# Patient Record
Sex: Male | Born: 1960 | Race: White | Hispanic: No | Smoking: Never smoker
Health system: Southern US, Community
[De-identification: ages and names within clinical notes are randomized; demographics above are authoritative.]

## PROBLEM LIST (undated history)

## (undated) DIAGNOSIS — K219 Gastro-esophageal reflux disease without esophagitis: Secondary | ICD-10-CM

## (undated) DIAGNOSIS — I10 Essential (primary) hypertension: Secondary | ICD-10-CM

## (undated) DIAGNOSIS — I2699 Other pulmonary embolism without acute cor pulmonale: Secondary | ICD-10-CM

## (undated) HISTORY — PX: BACK SURGERY: SHX140

## (undated) HISTORY — DX: Gastro-esophageal reflux disease without esophagitis: K21.9

## (undated) HISTORY — DX: Essential (primary) hypertension: I10

## (undated) HISTORY — DX: Other pulmonary embolism without acute cor pulmonale: I26.99

---

## 2013-08-23 DIAGNOSIS — J301 Allergic rhinitis due to pollen: Secondary | ICD-10-CM | POA: Insufficient documentation

## 2015-05-02 DIAGNOSIS — E669 Obesity, unspecified: Secondary | ICD-10-CM | POA: Insufficient documentation

## 2015-05-02 DIAGNOSIS — G8929 Other chronic pain: Secondary | ICD-10-CM | POA: Insufficient documentation

## 2015-05-02 DIAGNOSIS — R079 Chest pain, unspecified: Secondary | ICD-10-CM | POA: Insufficient documentation

## 2015-05-02 DIAGNOSIS — E7800 Pure hypercholesterolemia, unspecified: Secondary | ICD-10-CM | POA: Insufficient documentation

## 2015-05-02 DIAGNOSIS — M545 Low back pain, unspecified: Secondary | ICD-10-CM | POA: Insufficient documentation

## 2015-05-02 DIAGNOSIS — G629 Polyneuropathy, unspecified: Secondary | ICD-10-CM | POA: Insufficient documentation

## 2015-05-02 DIAGNOSIS — Z86711 Personal history of pulmonary embolism: Secondary | ICD-10-CM | POA: Insufficient documentation

## 2015-05-02 DIAGNOSIS — I10 Essential (primary) hypertension: Secondary | ICD-10-CM | POA: Insufficient documentation

## 2018-07-03 DIAGNOSIS — K8689 Other specified diseases of pancreas: Secondary | ICD-10-CM | POA: Insufficient documentation

## 2018-11-08 ENCOUNTER — Ambulatory Visit: Payer: No Typology Code available for payment source | Admitting: Nurse Practitioner

## 2018-12-04 ENCOUNTER — Other Ambulatory Visit: Payer: Self-pay

## 2018-12-04 ENCOUNTER — Encounter: Payer: Self-pay | Admitting: Pain Medicine

## 2018-12-04 ENCOUNTER — Ambulatory Visit: Payer: No Typology Code available for payment source | Attending: Pain Medicine | Admitting: Pain Medicine

## 2018-12-04 DIAGNOSIS — Z451 Encounter for adjustment and management of infusion pump: Secondary | ICD-10-CM | POA: Insufficient documentation

## 2018-12-04 DIAGNOSIS — M79602 Pain in left arm: Secondary | ICD-10-CM

## 2018-12-04 DIAGNOSIS — M5136 Other intervertebral disc degeneration, lumbar region: Secondary | ICD-10-CM | POA: Diagnosis not present

## 2018-12-04 DIAGNOSIS — M7918 Myalgia, other site: Secondary | ICD-10-CM | POA: Insufficient documentation

## 2018-12-04 DIAGNOSIS — M792 Neuralgia and neuritis, unspecified: Secondary | ICD-10-CM | POA: Insufficient documentation

## 2018-12-04 DIAGNOSIS — Z981 Arthrodesis status: Secondary | ICD-10-CM

## 2018-12-04 DIAGNOSIS — M961 Postlaminectomy syndrome, not elsewhere classified: Secondary | ICD-10-CM

## 2018-12-04 DIAGNOSIS — G8929 Other chronic pain: Secondary | ICD-10-CM

## 2018-12-04 DIAGNOSIS — M5030 Other cervical disc degeneration, unspecified cervical region: Secondary | ICD-10-CM

## 2018-12-04 DIAGNOSIS — M545 Low back pain, unspecified: Secondary | ICD-10-CM

## 2018-12-04 DIAGNOSIS — Z9889 Other specified postprocedural states: Secondary | ICD-10-CM

## 2018-12-04 DIAGNOSIS — Z978 Presence of other specified devices: Secondary | ICD-10-CM | POA: Insufficient documentation

## 2018-12-04 DIAGNOSIS — M546 Pain in thoracic spine: Secondary | ICD-10-CM

## 2018-12-04 DIAGNOSIS — M542 Cervicalgia: Secondary | ICD-10-CM

## 2018-12-04 DIAGNOSIS — M5412 Radiculopathy, cervical region: Secondary | ICD-10-CM | POA: Insufficient documentation

## 2018-12-04 DIAGNOSIS — G894 Chronic pain syndrome: Secondary | ICD-10-CM

## 2018-12-04 DIAGNOSIS — Z9689 Presence of other specified functional implants: Secondary | ICD-10-CM

## 2018-12-04 DIAGNOSIS — M899 Disorder of bone, unspecified: Secondary | ICD-10-CM

## 2018-12-04 DIAGNOSIS — G8928 Other chronic postprocedural pain: Secondary | ICD-10-CM

## 2018-12-04 DIAGNOSIS — Z79899 Other long term (current) drug therapy: Secondary | ICD-10-CM

## 2018-12-04 DIAGNOSIS — Z789 Other specified health status: Secondary | ICD-10-CM | POA: Insufficient documentation

## 2018-12-04 DIAGNOSIS — M5134 Other intervertebral disc degeneration, thoracic region: Secondary | ICD-10-CM | POA: Insufficient documentation

## 2018-12-04 NOTE — Patient Instructions (Addendum)
____________________________________________________________________________________________  Medication Rules  Purpose: To inform patients, and their family members, of our rules and regulations.  Applies to: All patients receiving prescriptions (written or electronic).  Pharmacy of record: Pharmacy where electronic prescriptions will be sent. If written prescriptions are taken to a different pharmacy, please inform the nursing staff. The pharmacy listed in the electronic medical record should be the one where you would like electronic prescriptions to be sent.  Electronic prescriptions: In compliance with the [place] Strengthen Opioid Misuse Prevention (STOP) Act of 2017 (Session Law 2017-74/H243), effective August 15, 2018, all controlled substances must be electronically prescribed. Calling prescriptions to the pharmacy will cease to exist.  Prescription refills: Only during scheduled appointments. Applies to all prescriptions.  NOTE: The following applies primarily to controlled substances (Opioid* Pain Medications).   Patient's responsibilities: 1. Pain Pills: Bring all pain pills to every appointment (except for procedure appointments). 2. Pill Bottles: Bring pills in original pharmacy bottle. Always bring the newest bottle. Bring bottle, even if empty. 3. Medication refills: You are responsible for knowing and keeping track of what medications you take and those you need refilled. The day before your appointment: write a list of all prescriptions that need to be refilled. The day of the appointment: give the list to the admitting nurse. Prescriptions will be written only during appointments. No prescriptions will be written on procedure days. If you forget a medication: it will not be "Called in", "Faxed", or "electronically sent". You will need to get another appointment to get these prescribed. No early refills. Do not call asking to have your prescription filled  early. 4. Prescription Accuracy: You are responsible for carefully inspecting your prescriptions before leaving our office. Have the discharge nurse carefully go over each prescription with you, before taking them home. Make sure that your name is accurately spelled, that your address is correct. Check the name and dose of your medication to make sure it is accurate. Check the number of pills, and the written instructions to make sure they are clear and accurate. Make sure that you are given enough medication to last until your next medication refill appointment. 5. Taking Medication: Take medication as prescribed. When it comes to controlled substances, taking less pills or less frequently than prescribed is permitted and encouraged. Never take more pills than instructed. Never take medication more frequently than prescribed.  6. Inform other Doctors: Always inform, all of your healthcare providers, of all the medications you take. 7. Pain Medication from other Providers: You are not allowed to accept any additional pain medication from any other Doctor or Healthcare provider. There are two exceptions to this rule. (see below) In the event that you require additional pain medication, you are responsible for notifying us, as stated below. 8. Medication Agreement: You are responsible for carefully reading and following our Medication Agreement. This must be signed before receiving any prescriptions from our practice. Safely store a copy of your signed Agreement. Violations to the Agreement will result in no further prescriptions. (Additional copies of our Medication Agreement are available upon request.) 9. Laws, Rules, & Regulations: All patients are expected to follow all Federal and State Laws, Statutes, Rules, & Regulations. Ignorance of the Laws does not constitute a valid excuse. The use of any illegal substances is prohibited. 10. Adopted CDC guidelines & recommendations: Target dosing levels will be  at or below 60 MME/day. Use of benzodiazepines** is not recommended.  Exceptions: There are only two exceptions to the rule of not   receiving pain medications from other Healthcare Providers. 1. Exception #1 (Emergencies): In the event of an emergency (i.e.: accident requiring emergency care), you are allowed to receive additional pain medication. However, you are responsible for: As soon as you are able, call our office [phone], at any time of the day or night, and leave a message stating your name, the date and nature of the emergency, and the name and dose of the medication prescribed. In the event that your call is answered by a member of our staff, make sure to document and save the date, time, and the name of the person that took your information.  2. Exception #2 (Planned Surgery): In the event that you are scheduled by another doctor or dentist to have any type of surgery or procedure, you are allowed (for a period no longer than 30 days), to receive additional pain medication, for the acute post-op pain. However, in this case, you are responsible for picking up a copy of our "Post-op Pain Management for Surgeons" handout, and giving it to your surgeon or dentist. This document is available at our office, and does not require an appointment to obtain it. Simply go to our office during business hours (Monday-Thursday from 8:00 AM to 4:00 PM) (Friday 8:00 AM to 12:00 Noon) or if you have a scheduled appointment with us, prior to your surgery, and ask for it by name. In addition, you will need to provide us with your name, name of your surgeon, type of surgery, and date of procedure or surgery.  *Opioid medications include: morphine, codeine, oxycodone, oxymorphone, hydrocodone, hydromorphone, meperidine, tramadol, tapentadol, buprenorphine, fentanyl, methadone. **Benzodiazepine medications include: diazepam (Valium), alprazolam (Xanax), clonazepam (Klonopine), lorazepam (Ativan), clorazepate  (Tranxene), chlordiazepoxide (Librium), estazolam (Prosom), oxazepam (Serax), temazepam (Restoril), triazolam (Halcion) (Last updated: 10/12/2017) ____________________________________________________________________________________________   ____________________________________________________________________________________________  Medication Recommendations and Reminders  Applies to: All patients receiving prescriptions (written and/or electronic).  Medication Rules & Regulations: These rules and regulations exist for your safety and that of others. They are not flexible and neither are we. Dismissing or ignoring them will be considered "non-compliance" with medication therapy, resulting in complete and irreversible termination of such therapy. (See document titled "Medication Rules" for more details.) In all conscience, because of safety reasons, we cannot continue providing a therapy where the patient does not follow instructions.  Pharmacy of record:   Definition: This is the pharmacy where your electronic prescriptions will be sent.   We do not endorse any particular pharmacy.  You are not restricted in your choice of pharmacy.  The pharmacy listed in the electronic medical record should be the one where you want electronic prescriptions to be sent.  If you choose to change pharmacy, simply notify our nursing staff of your choice of new pharmacy.  Recommendations:  Keep all of your pain medications in a safe place, under lock and key, even if you live alone.   After you fill your prescription, take 1 week's worth of pills and put them away in a safe place. You should keep a separate, properly labeled bottle for this purpose. The remainder should be kept in the original bottle. Use this as your primary supply, until it runs out. Once it's gone, then you know that you have 1 week's worth of medicine, and it is time to come in for a prescription refill. If you do this correctly, it  is unlikely that you will ever run out of medicine.  To make sure that the above recommendation works,   it is very important that you make sure your medication refill appointments are scheduled at least 1 week before you run out of medicine. To do this in an effective manner, make sure that you do not leave the office without scheduling your next medication management appointment. Always ask the nursing staff to show you in your prescription , when your medication will be running out. Then arrange for the receptionist to get you a return appointment, at least 7 days before you run out of medicine. Do not wait until you have 1 or 2 pills left, to come in. This is very poor planning and does not take into consideration that we may need to cancel appointments due to bad weather, sickness, or emergencies affecting our staff.  "Partial Fill": If for any reason your pharmacy does not have enough pills/tablets to completely fill or refill your prescription, do not allow for a "partial fill". You will need a separate prescription to fill the remaining amount, which we will not provide. If the reason for the partial fill is your insurance, you will need to talk to the pharmacist about payment alternatives for the remaining tablets, but again, do not accept a partial fill.  Prescription refills and/or changes in medication(s):   Prescription refills, and/or changes in dose or medication, will be conducted only during scheduled medication management appointments. (Applies to both, written and electronic prescriptions.)  No refills on procedure days. No medication will be changed or started on procedure days. No changes, adjustments, and/or refills will be conducted on a procedure day. Doing so will interfere with the diagnostic portion of the procedure.  No phone refills. No medications will be "called into the pharmacy".  No Fax refills.  No weekend refills.  No Holliday refills.  No after hours  refills.  Remember:  Business hours are:  Monday to Thursday 8:00 AM to 4:00 PM Provider's Schedule: Crystal King, NP - Appointments are:  Medication management: Monday to Thursday 8:00 AM to 4:00 PM Dotsie Gillette, MD - Appointments are:  Medication management: Monday and Wednesday 8:00 AM to 4:00 PM Procedure day: Tuesday and Thursday 7:30 AM to 4:00 PM Bilal Lateef, MD - Appointments are:  Medication management: Tuesday and Thursday 8:00 AM to 4:00 PM Procedure day: Monday and Wednesday 7:30 AM to 4:00 PM (Last update: 10/12/2017) ____________________________________________________________________________________________   ____________________________________________________________________________________________  CANNABIDIOL (AKA: CBD Oil or Pills)  Applies to: All patients receiving prescriptions of controlled substances (written and/or electronic).  General Information: Cannabidiol (CBD) was discovered in 1940. It is one of some 113 identified cannabinoids in cannabis (Marijuana) plants, accounting for up to 40% of the plant's extract. As of 2018, preliminary clinical research on cannabidiol included studies of anxiety, cognition, movement disorders, and pain.  Cannabidiol is consummed in multiple ways, including inhalation of cannabis smoke or vapor, as an aerosol spray into the cheek, and by mouth. It may be supplied as CBD oil containing CBD as the active ingredient (no added tetrahydrocannabinol (THC) or terpenes), a full-plant CBD-dominant hemp extract oil, capsules, dried cannabis, or as a liquid solution. CBD is thought not have the same psychoactivity as THC, and may affect the actions of THC. Studies suggest that CBD may interact with different biological targets, including cannabinoid receptors and other neurotransmitter receptors. As of 2018 the mechanism of action for its biological effects has not been determined.  In the [place], cannabidiol has a limited  approval by the Food and Drug Administration (FDA) for treatment of only two types   of epilepsy disorders. The side effects of long-term use of the drug include somnolence, decreased appetite, diarrhea, fatigue, malaise, weakness, sleeping problems, and others.  CBD remains a Schedule I drug prohibited for any use.  Legality: Some manufacturers ship CBD products nationally, an illegal action which the FDA has not enforced in 2018, with CBD remaining the subject of an FDA investigational new drug evaluation, and is not considered legal as a dietary supplement or food ingredient as of December 2018. Federal illegality has made it difficult historically to conduct research on CBD. CBD is openly sold in head shops and health food stores in some states where such sales have not been explicitly legalized.  Warning: Because it is not FDA approved for general use or treatment of pain, it is not required to undergo the same manufacturing controls as prescription drugs.  This means that the available cannabidiol (CBD) may be contaminated with THC.  If this is the case, it will trigger a positive urine drug screen (UDS) test for cannabinoids (Marijuana).  Because a positive UDS for illicit substances is a violation of our medication agreement, your opioid analgesics (pain medicine) may be permanently discontinued. (Last update: 11/02/2017) ____________________________________________________________________________________________    ______________________________________________________________________________________________  Specialty Pain Scale  Introduction:  There are significant differences in how pain is reported. The word pain usually refers to physical pain, but it is also a common synonym of suffering. The medical community uses a scale from 0 (zero) to 10 (ten) to report pain level. Zero (0) is described as "no pain", while ten (10) is described as "the worse pain you can imagine". The problem with  this scale is that physical pain is reported along with suffering. Suffering refers to mental pain, or more often yet it refers to any unpleasant feeling, emotion or aversion associated with the perception of harm or threat of harm. It is the psychological component of pain.  Pain Specialists prefer to separate the two components. The pain scale used by this practice is the Verbal Numerical Rating Scale (VNRS-11). This scale is for the physical pain only. DO NOT INCLUDE how your pain psychologically affects you. This scale is for adults 66 years of age and older. It has 11 (eleven) levels. The 1st level is 0/10. This means: "right now, I have no pain". In the context of pain management, it also means: "right now, my physical pain is under control with the current therapy".  General Information:  The scale should reflect your current level of pain. Unless you are specifically asked for the level of your worst pain, or your average pain. If you are asked for one of these two, then it should be understood that it is over the past 24 hours.  Levels 1 (one) through 5 (five) are described below, and can be treated as an outpatient. Ambulatory pain management facilities such as ours are more than adequate to treat these levels. Levels 6 (six) through 10 (ten) are also described below, however, these must be treated as a hospitalized patient. While levels 6 (six) and 7 (seven) may be evaluated at an urgent care facility, levels 8 (eight) through 10 (ten) constitute medical emergencies and as such, they belong in a hospital's emergency department. When having these levels (as described below), do not come to our office. Our facility is not equipped to manage these levels. Go directly to an urgent care facility or an emergency department to be evaluated.  Definitions:  Activities of Daily Living (ADL): Activities of daily living (  ADL or ADLs) is a term used in healthcare to refer to people's daily self-care  activities. Health professionals often use a person's ability or inability to perform ADLs as a measurement of their functional status, particularly in regard to people post injury, with disabilities and the elderly. There are two ADL levels: Basic and Instrumental. Basic Activities of Daily Living (BADL  or BADLs) consist of self-care tasks that include: Bathing and showering; personal hygiene and grooming (including brushing/combing/styling hair); dressing; Toilet hygiene (getting to the toilet, cleaning oneself, and getting back up); eating and self-feeding (not including cooking or chewing and swallowing); functional mobility, often referred to as "transferring", as measured by the ability to walk, get in and out of bed, and get into and out of a chair; the broader definition (moving from one place to another while performing activities) is useful for people with different physical abilities who are still able to get around independently. Basic ADLs include the things many people do when they get up in the morning and get ready to go out of the house: get out of bed, go to the toilet, bathe, dress, groom, and eat. On the average, loss of function typically follows a particular order. Hygiene is the first to go, followed by loss of toilet use and locomotion. The last to go is the ability to eat. When there is only one remaining area in which the person is independent, there is a 62.9% chance that it is eating and only a 3.5% chance that it is hygiene. Instrumental Activities of Daily Living (IADL or IADLs) are not necessary for fundamental functioning, but they let an individual live independently in a community. IADL consist of tasks that include: cleaning and maintaining the house; home establishment and maintenance; care of others (including selecting and supervising caregivers); care of pets; child rearing; managing money; managing financials (investments, etc.); meal preparation and cleanup; shopping for  groceries and necessities; moving within the community; safety procedures and emergency responses; health management and maintenance (taking prescribed medications); and using the telephone or other form of communication.  Instructions:  Most patients tend to report their pain as a combination of two factors, their physical pain and their psychosocial pain. This last one is also known as "suffering" and it is reflection of how physical pain affects you socially and psychologically. From now on, report them separately.  From this point on, when asked to report your pain level, report only your physical pain. Use the following table for reference.  Pain Clinic Pain Levels (0-5/10)  Pain Level Score  Description  No Pain 0   Mild pain 1 Nagging, annoying, but does not interfere with basic activities of daily living (ADL). Patients are able to eat, bathe, get dressed, toileting (being able to get on and off the toilet and perform personal hygiene functions), transfer (move in and out of bed or a chair without assistance), and maintain continence (able to control bladder and bowel functions). Blood pressure and heart rate are unaffected. A normal heart rate for a healthy adult ranges from 60 to 100 bpm (beats per minute).   Mild to moderate pain 2 Noticeable and distracting. Impossible to hide from other people. More frequent flare-ups. Still possible to adapt and function close to normal. It can be very annoying and may have occasional stronger flare-ups. With discipline, patients may get used to it and adapt.   Moderate pain 3 Interferes significantly with activities of daily living (ADL). It becomes difficult to feed, bathe, get  dressed, get on and off the toilet or to perform personal hygiene functions. Difficult to get in and out of bed or a chair without assistance. Very distracting. With effort, it can be ignored when deeply involved in activities.   Moderately severe pain 4 Impossible to ignore for  more than a few minutes. With effort, patients may still be able to manage work or participate in some social activities. Very difficult to concentrate. Signs of autonomic nervous system discharge are evident: dilated pupils (mydriasis); mild sweating (diaphoresis); sleep interference. Heart rate becomes elevated (>115 bpm). Diastolic blood pressure (lower number) rises above 100 mmHg. Patients find relief in laying down and not moving.   Severe pain 5 Intense and extremely unpleasant. Associated with frowning face and frequent crying. Pain overwhelms the senses.  Ability to do any activity or maintain social relationships becomes significantly limited. Conversation becomes difficult. Pacing back and forth is common, as getting into a comfortable position is nearly impossible. Pain wakes you up from deep sleep. Physical signs will be obvious: pupillary dilation; increased sweating; goosebumps; brisk reflexes; cold, clammy hands and feet; nausea, vomiting or dry heaves; loss of appetite; significant sleep disturbance with inability to fall asleep or to remain asleep. When persistent, significant weight loss is observed due to the complete loss of appetite and sleep deprivation.  Blood pressure and heart rate becomes significantly elevated. Caution: If elevated blood pressure triggers a pounding headache associated with blurred vision, then the patient should immediately seek attention at an urgent or emergency care unit, as these may be signs of an impending stroke.    Emergency Department Pain Levels (6-10/10)  Emergency Room Pain 6 Severely limiting. Requires emergency care and should not be seen or managed at an outpatient pain management facility. Communication becomes difficult and requires great effort. Assistance to reach the emergency department may be required. Facial flushing and profuse sweating along with potentially dangerous increases in heart rate and blood pressure will be evident.    Distressing pain 7 Self-care is very difficult. Assistance is required to transport, or use restroom. Assistance to reach the emergency department will be required. Tasks requiring coordination, such as bathing and getting dressed become very difficult.   Disabling pain 8 Self-care is no longer possible. At this level, pain is disabling. The individual is unable to do even the most "basic" activities such as walking, eating, bathing, dressing, transferring to a bed, or toileting. Fine motor skills are lost. It is difficult to think clearly.   Incapacitating pain 9 Pain becomes incapacitating. Thought processing is no longer possible. Difficult to remember your own name. Control of movement and coordination are lost.   The worst pain imaginable 10 At this level, most patients pass out from pain. When this level is reached, collapse of the autonomic nervous system occurs, leading to a sudden drop in blood pressure and heart rate. This in turn results in a temporary and dramatic drop in blood flow to the brain, leading to a loss of consciousness. Fainting is one of the body's self defense mechanisms. Passing out puts the brain in a calmed state and causes it to shut down for a while, in order to begin the healing process.    Summary: 1.   Refer to this scale when providing [place] with your pain level. 2.   Be accurate and careful when reporting your pain level. This will help with your care. 3.   Over-reporting your pain level will lead to loss of credibility. 4.  Even a level of 1/10 means that there is pain and will be treated at our facility. 5.   High, inaccurate reporting will be documented as "Symptom Exaggeration", leading to loss of credibility and suspicions of possible secondary gains such as obtaining more narcotics, or wanting to appear disabled, for fraudulent reasons. 6.   Only pain levels of 5 or below will be seen at our facility. 7.   Pain levels of 6 and above will be sent to the  Emergency Department and the appointment cancelled.  ______________________________________________________________________________________________    ____________________________________________________________________________________________  Drug Holidays (Slow)  What is a "Drug Holiday"? Drug Holiday: is the name given to the period of time during which a patient stops taking a medication(s) for the purpose of eliminating tolerance to the drug.  Benefits . Improved effectiveness of opioids. . Decreased opioid dose needed to achieve benefits. . Improved pain with lesser dose.  What is tolerance? Tolerance: is the progressive decreased in effectiveness of a drug due to its repetitive use. With repetitive use, the body gets use to the medication and as a consequence, it loses its effectiveness. This is a common problem seen with opioid pain medications. As a result, a larger dose of the drug is needed to achieve the same effect that used to be obtained with a smaller dose.  How long should a "Drug Holiday" last? At least 14 consecutive days. (2 weeks)  What are withdrawals? Withdrawals: refers to the wide range of symptoms that occur after stopping or dramatically reducing opiate drugs after heavy and prolonged use. Withdrawal symptoms do not occur to patients that use low dose opioids, or those who take the medication sporadically. Contrary to benzodiazepine (example: Valium, Xanax, etc.) or alcohol withdrawals ("Delirium Tremens"), opioid withdrawals are not lethal. Withdrawals are the physical manifestation of the body getting rid of the excess receptors.  Expected Symptoms Early symptoms of withdrawal may include: . Agitation . Anxiety . Muscle aches . Increased tearing . Insomnia . Runny nose . Sweating . Yawning  Late symptoms of withdrawal may include: . Abdominal cramping . Diarrhea . Dilated pupils . Goose bumps . Nausea . Vomiting  Will I experience  withdrawals? Due to the slow nature of the taper, it is very unlikely that you will experience any.  What is a slow taper? Taper: refers to the gradual decrease in dose. ___________________________________________________________________________________________

## 2018-12-04 NOTE — Progress Notes (Signed)
Patient's Name: Jeffery Cox  MRN: [id number]  Referring Provider: [facility]  DOB: 06/28/1961  PCP: [facility]  DOS: 12/04/2018  Note by: Gaspar Cola, MD  Service setting: Ambulatory outpatient  Specialty: Interventional Pain Management  Location: [facility] Pain Management Virtual Visit  Visit type: Initial Patient Evaluation  Patient type: New Patient   Pain Management Virtual Encounter Note - Virtual Visit via Telephone Telehealth (real-time audio visits between healthcare provider and patient).  Patient's Phone No.:  [phone] (home); There is no such number on file (mobile).; (Preferred) [phone] No e-mail address on record No Pharmacies Listed  Pre-screening note:  Our staff contacted Jeffery Cox and offered him an "in person", "face-to-face" appointment versus a telephone encounter. He indicated preferring the telephone encounter, at this time.  Primary Reason(s) for Visit: Tele-Encounter for initial evaluation of one or more chronic problems (new to examiner) potentially causing chronic pain, and posing a threat to normal musculoskeletal function. (Level of risk: High) CC: Back Pain (lower)  I contacted Jeffery Cox on 12/04/2018 at 2:56 PM via telephone and clearly identified myself as Gaspar Cola, MD. I verified that I was speaking with the correct person using two identifiers (Name and date of birth: 1961/02/28).  Advanced Informed Consent I sought verbal advanced consent from Jeffery Cox for virtual visit interactions. I informed Jeffery Cox of possible security and privacy concerns, risks, and limitations associated with providing "not-in-person" medical evaluation and management services. I also informed Jeffery Cox of the availability of "in-person" appointments. Finally, I informed him that there would be a charge for the virtual visit and that he could be  personally, fully or partially, financially responsible for it. Jeffery Cox expressed  understanding and agreed to proceed.   HPI  Jeffery Cox is a 58 y.o. year old, male patient, contacted today for an initial evaluation of his chronic pain. He has Chest pain; Chronic low back pain (Primary area of Pain) (Bilateral) (L>R); Essential hypertension with goal blood pressure less than 130/80; History of pulmonary embolism; Neuropathy; Obesity (BMI 30-39.9); Pancreatic mass; Pure hypercholesterolemia; Allergic rhinitis due to pollen; Chronic pain syndrome; DDD (degenerative disc disease), lumbar; DDD (degenerative disc disease), thoracic; DDD (degenerative disc disease), cervical; Chronic thoracic back pain (Secondary Area of Pain) (Bilateral) (L>R); Failed back surgical syndrome; History of cervical spinal surgery; Chronic neck pain with history of cervical spinal surgery (Tertiary Area of Pain) (Bilateral) (L>R); Chronic cervical radiculitis (Left); Chronic upper extremity pain (Fourth Area of Pain) (Left); Cervicalgia (Bilateral) (L>R); History of lumbar spine surgery; History of lumbar spinal fusion; History of thoracic spinal fusion; Neurogenic pain; Chronic musculoskeletal pain; S/P removal of spinal cord stimulator; Presence of Medtronic intrathecal pump; Adjustment and management of infusion pump; Disorder of skeletal system; Pharmacologic therapy; and Problems influencing health status on their problem list.  Pain Assessment: Location: Lower Back Radiating: n/a Onset: More than a month ago Duration: Chronic pain Quality: Sharp, Stabbing Severity: 4 /10 (subjective, self-reported pain score)  Effect on ADL: difficulty performing daily activities Timing: Intermittent Modifying factors: lying down, medications   Onset and Duration: Sudden and Present longer than 3 months Cause of pain: kyphosis, arthrits Severity: No change since onset, NAS-11 at its worse: 8/10, NAS-11 at its best: 4/10, NAS-11 now: 4/10 and NAS-11 on the average: 5/10 Timing: Not influenced by the time of the day,  During activity or exercise, After activity or exercise and After a period of immobility Aggravating Factors: Bending, Prolonged sitting and Prolonged standing Alleviating Factors:  Lying down and Medications Associated Problems: Depression, Numbness and Tingling Quality of Pain: Sharp and Stabbing Previous Examinations or Tests: CT scan, Ct-Myelogram, MRI scan, Neurological evaluation, Neurosurgical evaluation, Orthopedic evaluation, Chiropractic evaluation and Psychiatric evaluation Previous Treatments: Chiropractic manipulations, Epidural steroid injections, Morphine pump, Narcotic medications, Physical Therapy, Spinal cord stimulator and TENS  According to the patient the primary area of pain is that of the lower back, bilaterally, with the left side being worse than the right.  He has a history of 4 back surgeries starting with a fusion in 2005, a decompressive surgery for a hematoma 2 weeks later, another fusion around September 2016, and the last 1 around February 2019.  In addition to this he had a spinal cord stimulator trial, implant, and removal, as well as an intrathecal pump trial and implant.  The spinal cord stimulator was initially implanted around 2007 but it never worked well and after many attempts at trying to make it work, it was removed around 2009.  The spinal cord stimulator was a Medtronic dual lead device that was put in by Dr. Lollie Marrow (pain management/anesthesia).  [facility].  Subsequently the patient had an intrathecal pump implant around 2012 which seem to have worked well and that was replaced by a second pump around July 2019.  This was again done by Dr. Lollie Marrow at the [facility].  The pump is a Medtronic SynchroMed II device ([id number]; model [id number]) (implanted in January 18, 2018) this is a 40 mL pump running preservative-free Dilaudid 8 mg/mL + preservative-free bupivacaine 4 mg/mL +  preservative-free clonidine 25 mcg/mL at a rate of 1.4982 mg/day of Dilaudid, 0.7491 mg/day of bupivacaine, and 4.682 mcg/day of clonidine.  The pump was last refilled on 11/30/2018 and the alarm date is 01/26/2019.  In addition to this the patient is using a fentanyl 50 mcg/h patch.  He describes that after the [facility] was [facility], [facility] and now he does not get coverage for the pump refill visits.  At the very beginning the pump was being refilled on a monthly basis but this was soon changed to every 2 months by having decreased the infusion rate.  The entire history of the concentration and rate changes is unavailable to [place] at this time.  The patient's secondary area of pain is that of the upper thoracic back, bilaterally, with the left side being worse than the right.  He describes having had a fusion all the way up to T3.  In addition he describes having had a cervical fusion from C4-C6.  He describes having had CT scans, MRIs, all of which were done at [facility], [facility], [place].  However when I checked "care everywhere" I was unable to see any recent imaging from those regions.  He denies any type of interventional therapies in that area.  The patient's third area of pain is that of the neck, primarily in the area of the back, bilaterally, with the left being worse than the right.  He describes having had 2 cervical spine surgeries with the first 1 having been around 2004 at [facility] in [place].  The second 1 was around 2008 at [facility] in [facility].  Once again, he denies any nerve blocks in that area.  The patient's fourth area of pain is that of left upper extremity pain going down to the elbow through the front and lateral portion of the  arm as it gets closer to the elbow.  He denies any weakness, numbness, or tingling reaching the hand.  From the medical standpoint, he does have a history of having had a recent pancreatic cyst  discovered.  The patient was informed that my practice is divided into two sections: an interventional pain management section, as well as a completely separate and distinct medication management section. I explained that I have procedure days for my interventional therapies, and evaluation days for follow-ups and medication management. Because of the amount of documentation required during both, they are kept separated. This means that there is the possibility that he may be scheduled for a procedure on one day, and medication management the next. I have also informed him that because of staffing and facility limitations, I no longer take patients for medication management only. To illustrate the reasons for this, I gave the patient the example of surgeons, and how inappropriate it would be to refer a patient to his/her care, just to write for the post-surgical antibiotics on a surgery done by a different surgeon.   Because interventional pain management is my board-certified specialty, the patient was informed that joining my practice means that they are open to any and all interventional therapies. I made it clear that this does not mean that they will be forced to have any procedures done. What this means is that I believe interventional therapies to be essential part of the diagnosis and proper management of chronic pain conditions. Therefore, patients not interested in these interventional alternatives will be better served under the care of a different practitioner.  The patient was also made aware of my Comprehensive Pain Management Safety Guidelines where by joining my practice, they limit all of their nerve blocks and joint injections to those done by our practice, for as long as we are retained to manage their care.   Historic Controlled Substance Pharmacotherapy Review  PMP and historical list of controlled substances: Fentanyl 50 mcg/h patch; fentanyl 25 mcg/h patch; oxycodone IR 5 mg;  hydrocodone/APAP 10/325; methadone 5 mg. Most recent opioid analgesic: Fentanyl 50 Mcg/Hr Patch (120.00 MME) (#10) (11/21/18) (by: Marval Regal, MD) + Hydromorphone Hcl Powder (1280.00 MME) Current opioid analgesics: Fentanyl 50 Mcg/Hr Patch +  Hydromorphone Hcl Powder Highest recorded MME/day: 1,400 mg/day MME/day: 1,400 mg/day Medications: Bottles not available for inspection. Pharmacodynamics: Desired effects: Analgesia: The patient reports >50% benefit. Reported improvement in function: The patient reports medication allows him to accomplish basic ADLs. Clinically meaningful improvement in function (CMIF): Sustained CMIF goals met Perceived effectiveness: Described as relatively effective, allowing for increase in activities of daily living (ADL) Undesirable effects: Side-effects or Adverse reactions: None reported Historical Monitoring: The patient  reports previous drug use. List of all UDS Test(s): No results found. List of other Serum/Urine Drug Screening Test(s):  No results found. Historical Background Evaluation: [place] PMP: PDMP reviewed during this encounter. Six (6) year initial data search conducted.             PMP NARX Score Report:  Narcotic: 441 Sedative: 200 Stimulant: 000 [place] Department of public safety, offender search: Editor, commissioning Information) Non-contributory Risk Assessment Profile: Aberrant behavior: None observed or detected today Risk factors for fatal opioid overdose: None identified today PMP NARX Overdose Risk Score: 510 Fatal overdose hazard ratio (HR): Calculation deferred Non-fatal overdose hazard ratio (HR): Calculation deferred Risk of opioid abuse or dependence: 0.7-3.0% with doses ? 36 MME/day and 6.1-26% with doses ? 120 MME/day. Substance use disorder (SUD)  risk level: See below Personal History of Substance Abuse (SUD-Substance use disorder):  Alcohol: Negative  Illegal Drugs: Negative  Rx Drugs: Negative  ORT Risk Level calculation:  Low Risk Opioid Risk Tool - 12/04/18 1258      Family History of Substance Abuse   Alcohol  Negative    Illegal Drugs  Negative    Rx Drugs  Negative      Personal History of Substance Abuse   Alcohol  Negative    Illegal Drugs  Negative    Rx Drugs  Negative      Age   Age between 7-45 years   No      History of Preadolescent Sexual Abuse   History of Preadolescent Sexual Abuse  Negative or Male      Psychological Disease   Psychological Disease  Negative    Depression  Positive      Total Score   Opioid Risk Tool Scoring  1    Opioid Risk Interpretation  Low Risk      ORT Scoring interpretation table:  Score <3 = Low Risk for SUD  Score between 4-7 = Moderate Risk for SUD  Score >8 = High Risk for Opioid Abuse   PHQ-2 Depression Scale:  Total score:    PHQ-2 Scoring interpretation table: (Score and probability of major depressive disorder)  Score 0 = No depression  Score 1 = 15.4% Probability  Score 2 = 21.1% Probability  Score 3 = 38.4% Probability  Score 4 = 45.5% Probability  Score 5 = 56.4% Probability  Score 6 = 78.6% Probability   PHQ-9 Depression Scale:  Total score:    PHQ-9 Scoring interpretation table:  Score 0-4 = No depression  Score 5-9 = Mild depression  Score 10-14 = Moderate depression  Score 15-19 = Moderately severe depression  Score 20-27 = Severe depression (2.4 times higher risk of SUD and 2.89 times higher risk of overuse)   Pharmacologic Plan: As per protocol, I have not taken over any controlled substance management, pending the results of ordered tests and/or consults.            Initial impression: Pending review of available data and ordered tests.  Meds   Current Outpatient Medications:  .  atorvastatin (LIPITOR) 40 MG tablet, Take 40 mg by mouth daily., Disp: , Rfl:  .  Cholecalciferol ([employer] VITAMIN D3 PO), Take 2,000 Units by mouth daily., Disp: , Rfl:  .  cloNIDine (CATAPRES) 0.1 MG tablet, Take 0.1 mg by mouth 2 (two)  times daily., Disp: , Rfl:  .  docusate sodium (COLACE) 100 MG capsule, Take 100 mg by mouth daily. 3 capsules at night, Disp: , Rfl:  .  fentaNYL (DURAGESIC) 50 MCG/HR, Place 1 patch onto the skin every 3 (three) days., Disp: , Rfl:  .  fluticasone (FLONASE) 50 MCG/ACT nasal spray, Place 2 sprays into both nostrils daily., Disp: , Rfl:  .  gabapentin (NEURONTIN) 300 MG capsule, Take 300 mg by mouth 3 (three) times daily., Disp: , Rfl:  .  hydrOXYzine (ATARAX/VISTARIL) 25 MG tablet, Take 25 mg by mouth every 8 (eight) hours as needed., Disp: , Rfl:  .  metoprolol tartrate (LOPRESSOR) 50 MG tablet, Take 50 mg by mouth 2 (two) times daily., Disp: , Rfl:  .  nitroGLYCERIN (NITROSTAT) 0.3 MG SL tablet, Place 0.3 mg under the tongue every 5 (five) minutes as needed for chest pain., Disp: , Rfl:  .  omeprazole (PRILOSEC) 20 MG capsule, Take 20  mg by mouth daily., Disp: , Rfl:  .  polyethylene glycol (MIRALAX / GLYCOLAX) 17 g packet, Take 17 g by mouth daily., Disp: , Rfl:  .  senna (SENOKOT) 8.6 MG TABS tablet, Take 2 tablets by mouth daily as needed for mild constipation., Disp: , Rfl:  .  tamsulosin (FLOMAX) 0.4 MG CAPS capsule, Take 0.4 mg by mouth daily., Disp: , Rfl:   ROS  Cardiovascular: No reported cardiovascular signs or symptoms such as High blood pressure, coronary artery disease, abnormal heart rate or rhythm, heart attack, blood thinner therapy or heart weakness and/or failure Pulmonary or Respiratory: Lung problems Neurological: Abnormal skin sensations (Peripheral Neuropathy) and Curved spine Review of Past Neurological Studies: No results found for this or any previous visit. Psychological-Psychiatric: Depressed Gastrointestinal: Reflux or heatburn Genitourinary: No reported renal or genitourinary signs or symptoms such as difficulty voiding or producing urine, peeing blood, non-functioning kidney, kidney stones, difficulty emptying the bladder, difficulty controlling the flow of urine,  or chronic kidney disease Hematological: No reported hematological signs or symptoms such as prolonged bleeding, low or poor functioning platelets, bruising or bleeding easily, hereditary bleeding problems, low energy levels due to low hemoglobin or being anemic Endocrine: No reported endocrine signs or symptoms such as high or low blood sugar, rapid heart rate due to high thyroid levels, obesity or weight gain due to slow thyroid or thyroid disease Rheumatologic: No reported rheumatological signs and symptoms such as fatigue, joint pain, tenderness, swelling, redness, heat, stiffness, decreased range of motion, with or without associated rash Musculoskeletal: Negative for myasthenia gravis, muscular dystrophy, multiple sclerosis or malignant hyperthermia Work History: Disabled  Allergies  Mr. Dehart is allergic to morphine and related and zofran [ondansetron].  Laboratory Chemistry  Inflammation Markers (CRP: Acute Phase) (ESR: Chronic Phase) No results found.  Rheumatology Markers No results found.  Renal Function Markers No results found.  Hepatic Function Markers No results found.  Electrolytes No results found.  Neuropathy Markers No results found.  CNS Tests No results found.  Bone Pathology Markers No results found.  Coagulation Parameters No results found.  Cardiovascular Markers No results found.  ID Markers No results found.  CA Markers No results found.  Endocrine Markers No results found.  Note: Lab results reviewed.  Imaging Review   Complexity Note: No new results found.                        PFSH  Drug: Mr. Jallow  reports previous drug use. Alcohol:  reports previous alcohol use. Tobacco:  reports that he has been smoking. He has never used smokeless tobacco. Medical:  has a past medical history of GERD (gastroesophageal reflux disease), Hypertension, and Pulmonary embolism ([place]). Family: family history is not on file.  Active  Ambulatory Problems    Diagnosis Date Noted  . Chest pain 05/02/2015  . Chronic low back pain (Primary area of Pain) (Bilateral) (L>R) 05/02/2015  . Essential hypertension with goal blood pressure less than 130/80 05/02/2015  . History of pulmonary embolism 05/02/2015  . Neuropathy 05/02/2015  . Obesity (BMI 30-39.9) 05/02/2015  . Pancreatic mass 07/03/2018  . Pure hypercholesterolemia 05/02/2015  . Allergic rhinitis due to pollen 08/23/2013  . Chronic pain syndrome 12/04/2018  . DDD (degenerative disc disease), lumbar 12/04/2018  . DDD (degenerative disc disease), thoracic 12/04/2018  . DDD (degenerative disc disease), cervical 12/04/2018  . Chronic thoracic back pain (Secondary Area of Pain) (Bilateral) (L>R) 12/04/2018  . Failed back surgical  syndrome 12/04/2018  . History of cervical spinal surgery 12/04/2018  . Chronic neck pain with history of cervical spinal surgery [facility] Area of Pain) (Bilateral) (L>R) 12/04/2018  . Chronic cervical radiculitis (Left) 12/04/2018  . Chronic upper extremity pain (Fourth Area of Pain) (Left) 12/04/2018  . Cervicalgia (Bilateral) (L>R) 12/04/2018  . History of lumbar spine surgery 12/04/2018  . History of lumbar spinal fusion 12/04/2018  . History of thoracic spinal fusion 12/04/2018  . Neurogenic pain 12/04/2018  . Chronic musculoskeletal pain 12/04/2018  . S/P removal of spinal cord stimulator 12/04/2018  . Presence of Medtronic intrathecal pump 12/04/2018  . Adjustment and management of infusion pump 12/04/2018  . Disorder of skeletal system 12/04/2018  . Pharmacologic therapy 12/04/2018  . Problems influencing health status 12/04/2018   Resolved Ambulatory Problems    Diagnosis Date Noted  . No Resolved Ambulatory Problems   Past Medical History:  Diagnosis Date  . GERD (gastroesophageal reflux disease)   . Hypertension   . Pulmonary embolism [facility])    Assessment  Primary Diagnosis & Pertinent Problem List: The primary  encounter diagnosis was Chronic low back pain (Primary area of Pain) (Bilateral) (L>R). Diagnoses of Failed back surgical syndrome, DDD (degenerative disc disease), lumbar, Chronic thoracic back pain (Secondary Area of Pain) (Bilateral) (L>R), DDD (degenerative disc disease), thoracic, History of thoracic spinal fusion, Chronic neck pain with history of cervical spinal surgery (Tertiary Area of Pain) (Bilateral) (L>R), DDD (degenerative disc disease), cervical, History of cervical spinal surgery, Chronic upper extremity pain (Fourth Area of Pain) (Left), Chronic cervical radiculitis (Left), Cervicalgia (Bilateral) (L>R), Chronic musculoskeletal pain, Neurogenic pain, S/P removal of spinal cord stimulator, Presence of Medtronic intrathecal pump, Chronic pain syndrome, Pharmacologic therapy, Disorder of skeletal system, and Problems influencing health status were also pertinent to this visit.  Visit Diagnosis (New problems to examiner): 1. Chronic low back pain (Primary area of Pain) (Bilateral) (L>R)   2. Failed back surgical syndrome   3. DDD (degenerative disc disease), lumbar   4. Chronic thoracic back pain (Secondary Area of Pain) (Bilateral) (L>R)   5. DDD (degenerative disc disease), thoracic   6. History of thoracic spinal fusion   7. Chronic neck pain with history of cervical spinal surgery (Tertiary Area of Pain) (Bilateral) (L>R)   8. DDD (degenerative disc disease), cervical   9. History of cervical spinal surgery   10. Chronic upper extremity pain (Fourth Area of Pain) (Left)   11. Chronic cervical radiculitis (Left)   12. Cervicalgia (Bilateral) (L>R)   13. Chronic musculoskeletal pain   14. Neurogenic pain   15. S/P removal of spinal cord stimulator   16. Presence of Medtronic intrathecal pump   17. Chronic pain syndrome   18. Pharmacologic therapy   19. Disorder of skeletal system   20. Problems influencing health status    Plan of Care (Initial workup plan)  Note: Mr. Staebell  was reminded that as per protocol, today's visit has been an evaluation only. We have not taken over the patient's controlled substance management.  Problem-specific plan: No problem-specific Assessment & Plan notes found for this encounter.   Lab Orders     Compliance Drug Analysis, Ur     Comp. Metabolic Panel (12)     Magnesium     Vitamin B12     Sedimentation rate     25-Hydroxyvitamin D Lcms D2+D3     C-reactive protein  Imaging Orders     DG Lumbar Spine Complete W/Bend  DG Thoracic Spine 2 View Referral Orders  No referral(s) requested today   Procedure Orders    No procedure(s) ordered today   Pharmacotherapy (current): Medications ordered:  No orders of the defined types were placed in this encounter.  Medications administered during this visit: Derius Ghosh had no medications administered during this visit.   Pharmacological management options:  Opioid Analgesics: The patient was informed that there is no guarantee that he would be a candidate for opioid analgesics. The decision will be made following CDC guidelines. This decision will be based on the results of diagnostic studies, as well as Mr. Kullman risk profile.   Membrane stabilizer: To be determined at a later time  Muscle relaxant: To be determined at a later time  NSAID: To be determined at a later time  Other analgesic(s): To be determined at a later time   Interventional management options: Mr. Esterly was informed that there is no guarantee that he would be a candidate for interventional therapies. The decision will be based on the results of diagnostic studies, as well as Mr. Calabretta risk profile.  Procedure(s) under consideration:  Palliative intrathecal pump refill and management the patient's next refill is due around 01/26/2019.   Provider-requested follow-up: Return in about 1 month (around 01/03/2019) for 2nd Visit (Post-tests), w/ Dr. Dossie Arbour, (Virtual).  No future  appointments.  Primary Care Physician: [place] Location: [facility] Outpatient Pain Management Facility Note by: Gaspar Cola, MD Date: 12/04/2018; Time: 2:56 PM

## 2018-12-05 ENCOUNTER — Telehealth: Payer: Self-pay

## 2018-12-05 NOTE — Telephone Encounter (Signed)
Scheduled for June 2 at 11am

## 2018-12-05 NOTE — Telephone Encounter (Signed)
This patient has an intrathecal pump that will need to be refilled before 01/26/2019. Put a medication order for 40 mL's of a solution containing preservative-free Dilaudid 10 mg/mL; preservative-free bupivacaine 10 mg/mL; and preservative-free clonidine 25 mcg/mL.  Please schedule for a pump refill appointment the first week of June per Dr Laban Emperor and send [facility] the date and time of appointment please.  Thank you

## 2018-12-12 ENCOUNTER — Ambulatory Visit
Admission: RE | Admit: 2018-12-12 | Discharge: 2018-12-12 | Disposition: A | Discharge status: Home / Self Care | Payer: No Typology Code available for payment source | Source: Ambulatory Visit | Attending: Pain Medicine | Admitting: Pain Medicine

## 2018-12-12 ENCOUNTER — Other Ambulatory Visit: Payer: Self-pay

## 2018-12-12 ENCOUNTER — Other Ambulatory Visit
Admission: RE | Admit: 2018-12-12 | Discharge: 2018-12-12 | Disposition: A | Discharge status: Home / Self Care | Payer: No Typology Code available for payment source | Source: Ambulatory Visit | Attending: Pain Medicine | Admitting: Pain Medicine

## 2018-12-12 DIAGNOSIS — M5134 Other intervertebral disc degeneration, thoracic region: Secondary | ICD-10-CM

## 2018-12-12 DIAGNOSIS — M546 Pain in thoracic spine: Secondary | ICD-10-CM | POA: Insufficient documentation

## 2018-12-12 DIAGNOSIS — M961 Postlaminectomy syndrome, not elsewhere classified: Secondary | ICD-10-CM | POA: Insufficient documentation

## 2018-12-12 DIAGNOSIS — Z981 Arthrodesis status: Secondary | ICD-10-CM | POA: Diagnosis present

## 2018-12-12 DIAGNOSIS — M545 Low back pain, unspecified: Secondary | ICD-10-CM

## 2018-12-12 DIAGNOSIS — G8929 Other chronic pain: Secondary | ICD-10-CM | POA: Insufficient documentation

## 2018-12-12 DIAGNOSIS — M5136 Other intervertebral disc degeneration, lumbar region: Secondary | ICD-10-CM | POA: Diagnosis present

## 2018-12-12 DIAGNOSIS — Z978 Presence of other specified devices: Secondary | ICD-10-CM | POA: Insufficient documentation

## 2018-12-12 LAB — COMPREHENSIVE METABOLIC PANEL
ALT: 21 U/L (ref 0–44)
AST: 18 U/L (ref 15–41)
Albumin: 3.7 g/dL (ref 3.5–5.0)
Alkaline Phosphatase: 76 U/L (ref 38–126)
Anion gap: 8 (ref 5–15)
BUN: 14 mg/dL (ref 6–20)
CO2: 25 mmol/L (ref 22–32)
Calcium: 8.7 mg/dL — ABNORMAL LOW (ref 8.9–10.3)
Chloride: 106 mmol/L (ref 98–111)
Creatinine, Ser: 0.7 mg/dL (ref 0.61–1.24)
GFR calc Af Amer: >60 mL/min (ref >60)
GFR calc non Af Amer: >60 mL/min (ref >60)
Glucose, Bld: 113 mg/dL — ABNORMAL HIGH (ref 70–99)
Potassium: 3.9 mmol/L (ref 3.5–5.1)
Sodium: 139 mmol/L (ref 135–145)
Total Bilirubin: 0.5 mg/dL (ref 0.3–1.2)
Total Protein: 7 g/dL (ref 6.5–8.1)

## 2018-12-12 LAB — MAGNESIUM: Magnesium: 1.9 mg/dL (ref 1.7–2.4)

## 2018-12-12 LAB — SEDIMENTATION RATE: Sed Rate: 46 mm/hr — ABNORMAL HIGH (ref 0–20)

## 2018-12-12 LAB — C-REACTIVE PROTEIN: CRP: 2.3 mg/dL — ABNORMAL HIGH (ref <1.0)

## 2018-12-12 LAB — VITAMIN B12: Vitamin B-12: 307 pg/mL (ref 180–914)

## 2018-12-13 LAB — VITAMIN D 25 HYDROXY (VIT D DEFICIENCY, FRACTURES): Vit D, 25-Hydroxy: 33.4 ng/mL (ref 30.0–100.0)

## 2018-12-15 LAB — COMPLIANCE DRUG ANALYSIS, UR

## 2018-12-31 ENCOUNTER — Encounter: Payer: Self-pay | Admitting: Pain Medicine

## 2018-12-31 NOTE — Progress Notes (Signed)
Patient's Name: Heaton Winterbottom  MRN: [id number]  Referring Provider: [facility]  DOB: April 20, 1961  PCP: [facility]  DOS: 01/01/2019  Note by: Oswaldo Done, MD  Service setting: Virtual Visit (Telephone)  Attending: Oswaldo Done, MD  Location: Telephone Encounter  Specialty: Interventional Pain Management  Patient type: Established   Pain Management Virtual Encounter Note - Virtual Visit via Telephone Telehealth (real-time audio visits between healthcare provider and patient).  Patient's Phone No.:  [phone] (home); There is no such number on file (mobile).; (Preferred) [phone] No e-mail address on record No Pharmacies Listed  Pre-screening note:  Our staff contacted Mr. Hackley and offered him an "in person", "face-to-face" appointment versus a telephone encounter. He indicated preferring the telephone encounter, at this time.   Primary Reason(s) for Virtual Visit: Encounter for evaluation before starting new chronic pain management plan of care (Level of risk: moderate) COVID-19*  Social distancing based on CDC ans AMA recommendations.   I contacted Rosary Lively on 01/01/2019 at 2:27 PM via telephone.      I clearly identified myself as Oswaldo Done, MD. I verified that I was speaking with the correct person using two identifiers (Name and date of birth: 10/10/1960).  Advanced Informed Consent I sought verbal advanced consent from Rosary Lively for virtual visit interactions. I informed Mr. Malbrough of possible security and privacy concerns, risks, and limitations associated with providing "not-in-person" medical evaluation and management services. I also informed Mr. Torain of the availability of "in-person" appointments. Finally, I informed him that there would be a charge for the virtual visit and that he could be  personally, fully or partially, financially responsible for it. Mr. Orloff expressed understanding and agreed to proceed.   NOTE: As it turns  out, apparently the [place] has decided to pay for him to continue receiving services at the same practice where he was before and therefore he does not need to drive all the way to [facility] for his care.  Today he informed us that he will not be needing our services.  I wished him well and told him that if he had any questions we would be more than happy to answer those for him.   Primary Care Physician: [facility] Location: Telephone Virtual Visit Note by: Oswaldo Done, MD Date: 01/01/2019; Time: 2:27 PM  Disclaimer:  * Given the special circumstances of the COVID-19 pandemic, the federal government has announced that the Office for Civil Rights (OCR) will exercise its enforcement discretion and will not impose penalties on physicians using telehealth in the event of noncompliance with regulatory requirements under the [employer] Portability and Accountability Act (HIPAA) in connection with the good faith provision of telehealth during the COVID-19 national public health emergency. (AMA)

## 2019-01-01 ENCOUNTER — Other Ambulatory Visit: Payer: Self-pay

## 2019-01-01 ENCOUNTER — Ambulatory Visit: Payer: No Typology Code available for payment source | Attending: Pain Medicine | Admitting: Pain Medicine

## 2019-01-01 DIAGNOSIS — Z9889 Other specified postprocedural states: Secondary | ICD-10-CM

## 2019-01-01 DIAGNOSIS — G894 Chronic pain syndrome: Secondary | ICD-10-CM

## 2019-01-01 DIAGNOSIS — M961 Postlaminectomy syndrome, not elsewhere classified: Secondary | ICD-10-CM

## 2019-01-01 DIAGNOSIS — M545 Low back pain, unspecified: Secondary | ICD-10-CM

## 2019-01-01 DIAGNOSIS — Z978 Presence of other specified devices: Secondary | ICD-10-CM

## 2019-01-01 DIAGNOSIS — M5030 Other cervical disc degeneration, unspecified cervical region: Secondary | ICD-10-CM

## 2019-01-01 DIAGNOSIS — M542 Cervicalgia: Secondary | ICD-10-CM

## 2019-01-01 DIAGNOSIS — G8929 Other chronic pain: Secondary | ICD-10-CM

## 2019-01-01 DIAGNOSIS — M546 Pain in thoracic spine: Secondary | ICD-10-CM

## 2019-01-01 DIAGNOSIS — M5136 Other intervertebral disc degeneration, lumbar region: Secondary | ICD-10-CM

## 2019-01-01 DIAGNOSIS — G8928 Other chronic postprocedural pain: Secondary | ICD-10-CM

## 2019-01-01 DIAGNOSIS — M5134 Other intervertebral disc degeneration, thoracic region: Secondary | ICD-10-CM

## 2019-01-01 DIAGNOSIS — M79602 Pain in left arm: Secondary | ICD-10-CM

## 2019-01-02 ENCOUNTER — Ambulatory Visit: Payer: No Typology Code available for payment source | Admitting: Pain Medicine

## 2019-01-02 ENCOUNTER — Other Ambulatory Visit: Payer: Self-pay

## 2019-01-02 NOTE — Telephone Encounter (Signed)
Cancel appointment please.  Per Dr Laban Emperor

## 2019-01-15 ENCOUNTER — Ambulatory Visit: Payer: No Typology Code available for payment source | Admitting: Pain Medicine

## 2019-09-12 IMAGING — CR LUMBAR SPINE - COMPLETE WITH BENDING VIEWS
7 series · 7 of 7 positions shown · non-contrast
Comparison: None.

CLINICAL DATA: Low back pain

EXAM:
LUMBAR SPINE - COMPLETE WITH BENDING VIEWS

[l-spine ap]
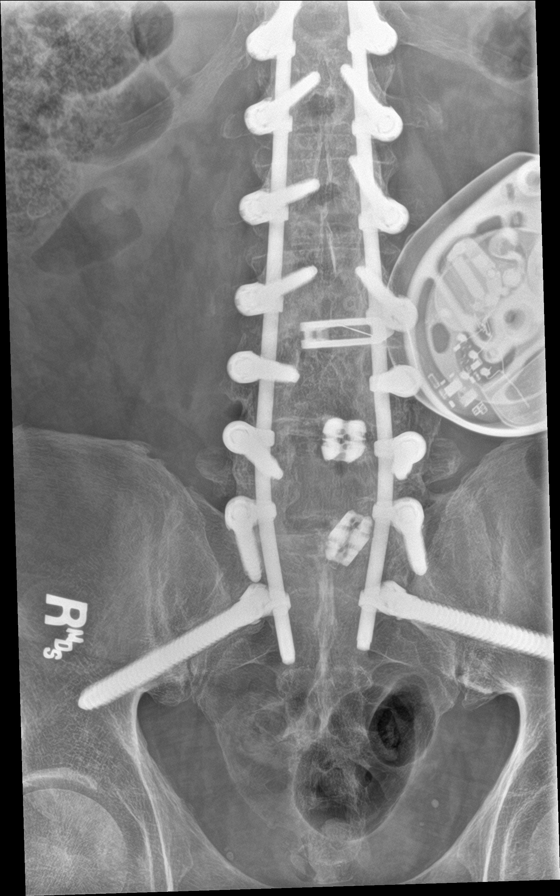

[l-spine obl (1 of 2)]
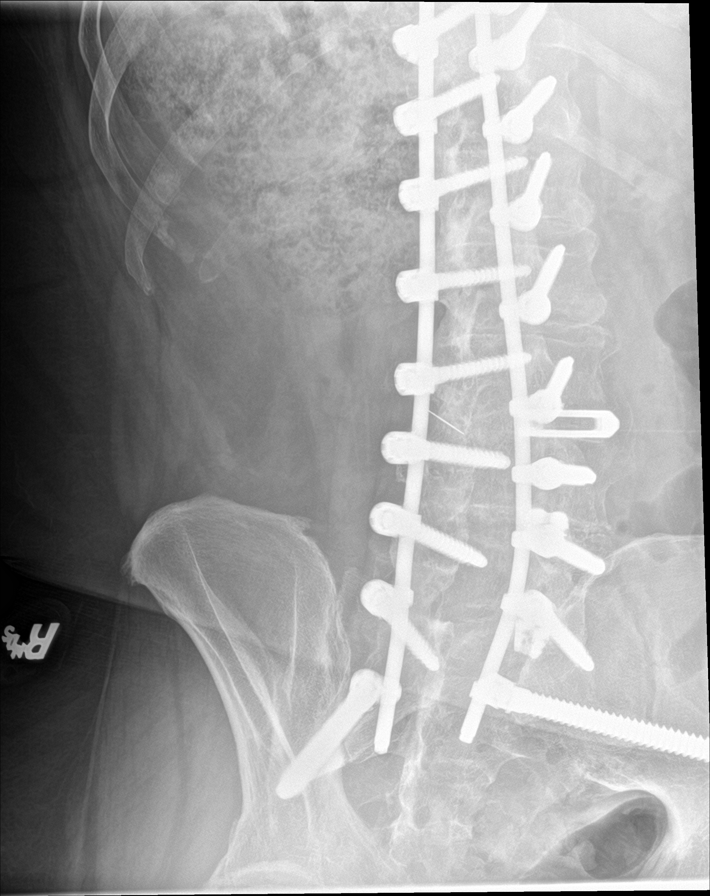

[l-spine obl (2 of 2)]
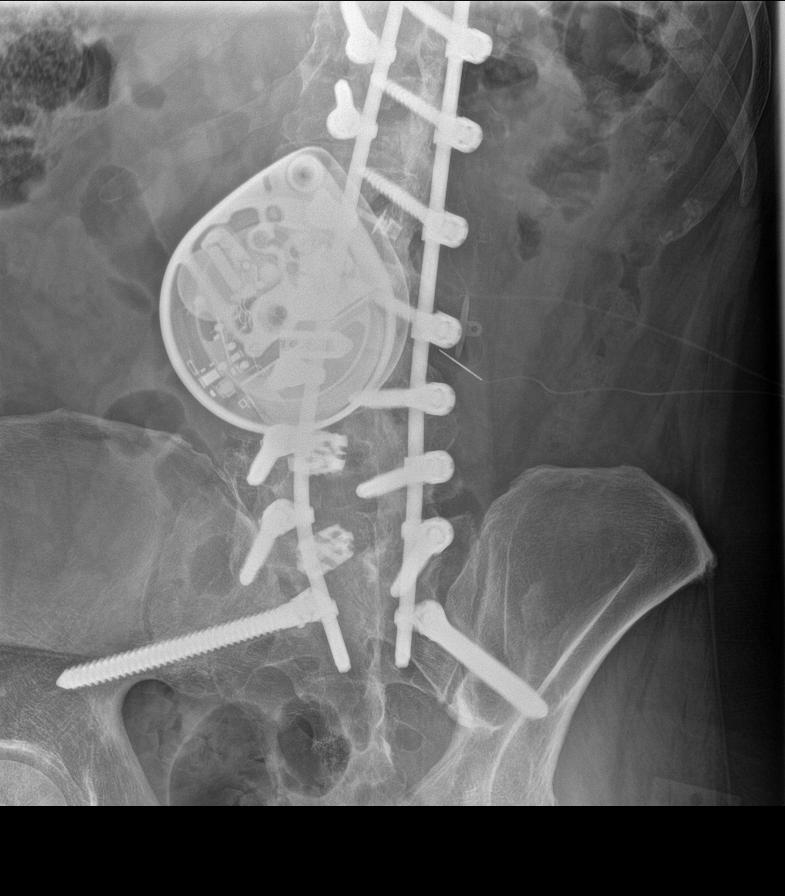

[l-spine lat]
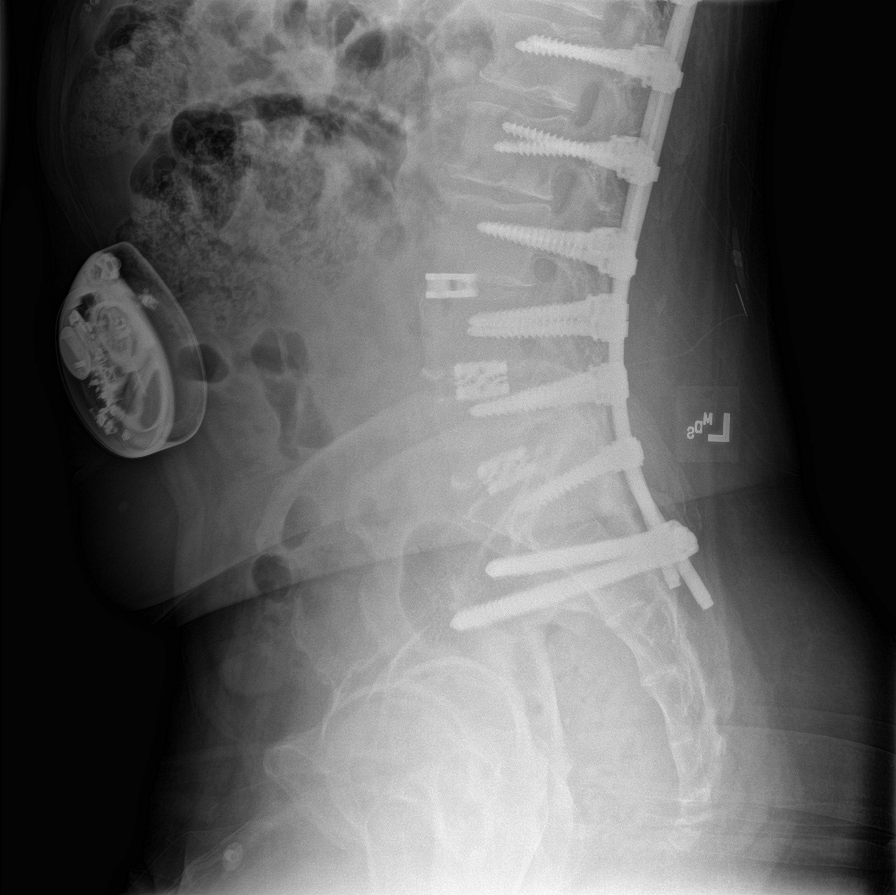

[l-spine flex]
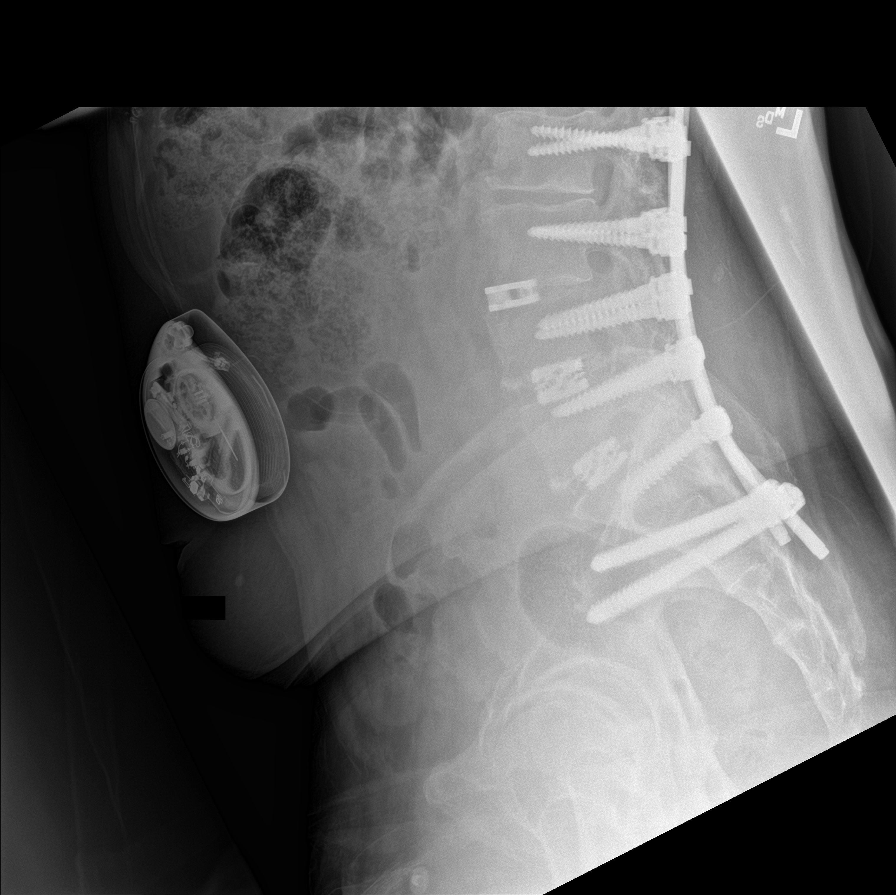

[l-spine ext]
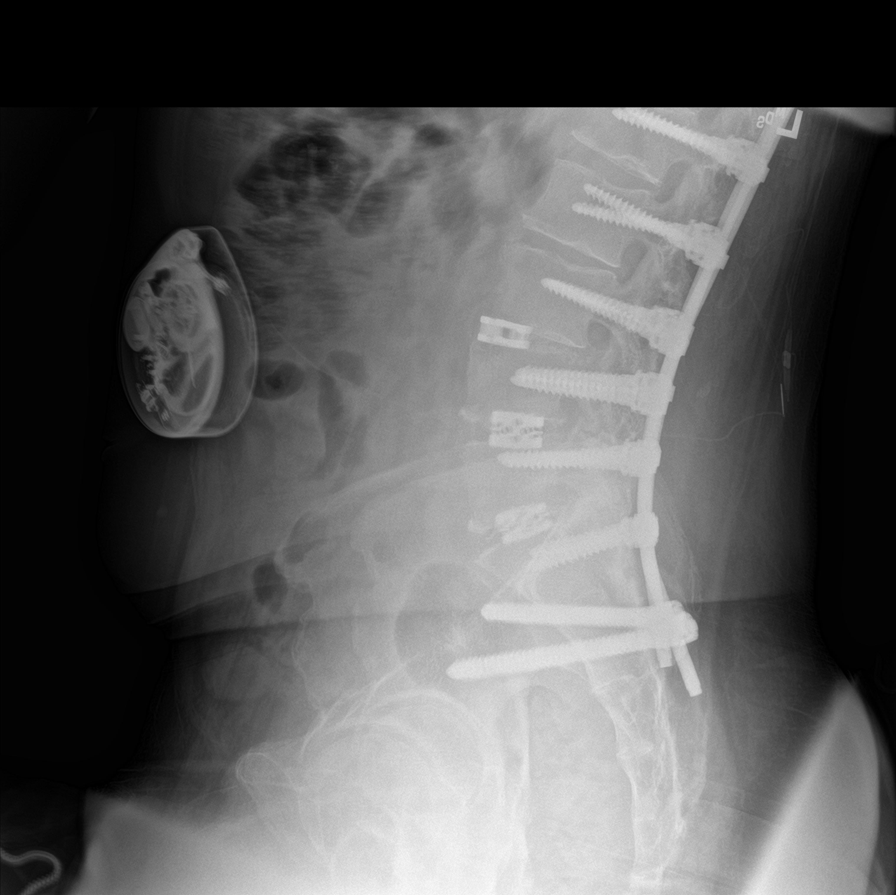

[l-spine spot]
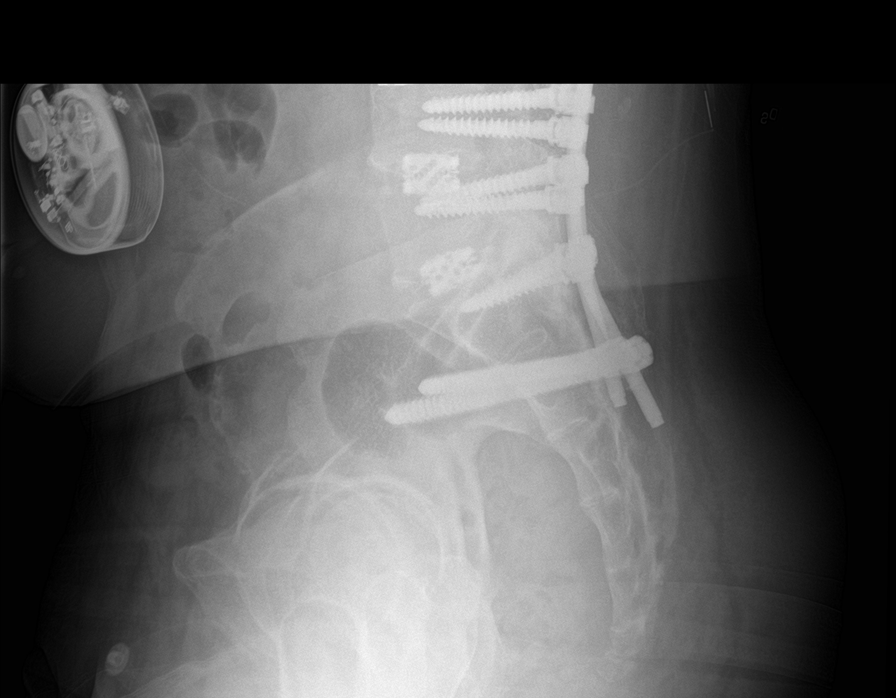

[7 of 7 positions shown; findings below may reference images not displayed]

FINDINGS: Frontal, neutral lateral, flexion lateral, extension lateral, and
bilateral oblique views were obtained. There are 5 non-rib-bearing
lumbar type vertebral bodies. There is posterior screw and plate
fixation at all levels with screws transfixing each sacroiliac
joint. Each pedicle screw is within the respective vertebral body.
There are disc spacers at L3-4, L4-5, and L5-S1. Support hardware
intact at all levels.

There is no appreciable fracture. There is no appreciable
spondylolisthesis on neutral lateral imaging. There is no
appreciable change in lateral alignment between neutral lateral,
flexion lateral, and extension lateral imaging. There is moderate
disc space narrowing at L2-3 and L3-4. There is milder disc space
narrowing at L4-5 and L5-S1. Note that there are disc spacers at
L3-4, L4-5, and L5-S1. Although the facets are less than optimally
visualized due to the overlying support hardware, there is felt to
be a degree of facet osteoarthritic change at all levels.

There is an infusion pump on the left anteriorly. The catheter tip
is located at the level of L4-5, apparently immediately posterior to
the thecal sac. The catheter tip is not appreciable within the
thecal sac on submitted images.
IMPRESSION: 1.  Extensive postoperative change with support hardware intact.

2. No evident fracture or spondylolisthesis. No appreciable change
in lateral alignment between neutral lateral, flexion lateral,
extension lateral imaging.

3.  Multilevel arthropathy.

4. There is an infusion port. The tip of the infusion catheter
appears posteriorly at L4-5; the tip appears slightly posterior to
the thecal sac and is not felt to reside within the thecal sac
currently.

## 2020-05-15 DEATH — deceased

## 2020-11-06 ENCOUNTER — Other Ambulatory Visit: Payer: Self-pay
